# Patient Record
Sex: Female | Born: 1970 | Race: White | Hispanic: No | Marital: Married | Smoking: Never smoker
Health system: Southern US, Community
[De-identification: ages and names within clinical notes are randomized; demographics above are authoritative.]

## PROBLEM LIST (undated history)

## (undated) DIAGNOSIS — F32A Depression, unspecified: Secondary | ICD-10-CM

## (undated) DIAGNOSIS — E079 Disorder of thyroid, unspecified: Secondary | ICD-10-CM

## (undated) DIAGNOSIS — F419 Anxiety disorder, unspecified: Secondary | ICD-10-CM

## (undated) DIAGNOSIS — O24419 Gestational diabetes mellitus in pregnancy, unspecified control: Secondary | ICD-10-CM

## (undated) HISTORY — DX: Anxiety disorder, unspecified: F41.9

## (undated) HISTORY — DX: Disorder of thyroid, unspecified: E07.9

## (undated) HISTORY — DX: Depression, unspecified: F32.A

## (undated) HISTORY — PX: WISDOM TOOTH EXTRACTION: SHX21

## (undated) HISTORY — PX: COLONOSCOPY: SHX174

---

## 2007-10-15 DIAGNOSIS — O24419 Gestational diabetes mellitus in pregnancy, unspecified control: Secondary | ICD-10-CM

## 2007-10-15 HISTORY — DX: Gestational diabetes mellitus in pregnancy, unspecified control: O24.419

## 2018-09-04 ENCOUNTER — Other Ambulatory Visit: Payer: Self-pay

## 2018-09-04 ENCOUNTER — Encounter (HOSPITAL_COMMUNITY): Payer: Self-pay | Admitting: Emergency Medicine

## 2018-09-04 ENCOUNTER — Emergency Department (HOSPITAL_COMMUNITY): Payer: BLUE CROSS/BLUE SHIELD

## 2018-09-04 ENCOUNTER — Emergency Department (HOSPITAL_COMMUNITY)
Admission: EM | Admit: 2018-09-04 | Discharge: 2018-09-04 | Disposition: A | Discharge status: Home / Self Care | Payer: BLUE CROSS/BLUE SHIELD | Attending: Emergency Medicine | Admitting: Emergency Medicine

## 2018-09-04 DIAGNOSIS — R1013 Epigastric pain: Secondary | ICD-10-CM | POA: Diagnosis present

## 2018-09-04 DIAGNOSIS — M5412 Radiculopathy, cervical region: Secondary | ICD-10-CM | POA: Diagnosis not present

## 2018-09-04 HISTORY — DX: Gestational diabetes mellitus in pregnancy, unspecified control: O24.419

## 2018-09-04 LAB — CBC
HEMATOCRIT: 44.4 % (ref 36.0–46.0)
Hemoglobin: 14.3 g/dL (ref 12.0–15.0)
MCH: 30 pg (ref 26.0–34.0)
MCHC: 32.2 g/dL (ref 30.0–36.0)
MCV: 93.1 fL (ref 80.0–100.0)
NRBC: 0 % (ref 0.0–0.2)
Platelets: 261 10*3/uL (ref 150–400)
RBC: 4.77 MIL/uL (ref 3.87–5.11)
RDW: 12.2 % (ref 11.5–15.5)
WBC: 6.4 10*3/uL (ref 4.0–10.5)

## 2018-09-04 LAB — COMPREHENSIVE METABOLIC PANEL
ALK PHOS: 114 U/L (ref 38–126)
ALT: 22 U/L (ref 0–44)
AST: 23 U/L (ref 15–41)
Albumin: 4.2 g/dL (ref 3.5–5.0)
Anion gap: 10 (ref 5–15)
BILIRUBIN TOTAL: 0.9 mg/dL (ref 0.3–1.2)
BUN: 10 mg/dL (ref 6–20)
CALCIUM: 9.5 mg/dL (ref 8.9–10.3)
CO2: 21 mmol/L — AB (ref 22–32)
CREATININE: 0.74 mg/dL (ref 0.44–1.00)
Chloride: 108 mmol/L (ref 98–111)
GFR calc non Af Amer: >60 mL/min (ref >60)
GLUCOSE: 106 mg/dL — AB (ref 70–99)
Potassium: 4.2 mmol/L (ref 3.5–5.1)
SODIUM: 139 mmol/L (ref 135–145)
Total Protein: 6.7 g/dL (ref 6.5–8.1)

## 2018-09-04 LAB — LIPASE, BLOOD: Lipase: 31 U/L (ref 11–51)

## 2018-09-04 LAB — I-STAT BETA HCG BLOOD, ED (MC, WL, AP ONLY)

## 2018-09-04 LAB — I-STAT TROPONIN, ED: Troponin i, poc: 0.02 ng/mL (ref 0.00–0.08)

## 2018-09-04 MED ORDER — PREDNISONE 10 MG (21) PO TBPK: ORAL_TABLET | ORAL | 0 refills | Status: DC

## 2018-09-04 NOTE — ED Triage Notes (Signed)
Pt c/o left sharp pain this morning, went to urgent care and was sent here. Reports that she had sharp central CP last week that resolved.

## 2018-09-04 NOTE — ED Provider Notes (Signed)
[facility] EMER[facility]ENC[facility] DEPARTMENT Provider Note   CSN: [id number] Arrival date & time: 09/04/18  1027     History   Chief Complaint Chief Complaint  Patient presents with  . Arm Pain    HPI Karen Mathis is a 47 y.o. female.  Pt presents to the ED today with left arm pain.  She had some epigastric abd pain last week which lasted about 15 min and went away.  She said she woke up this morning with a sharp pain in her left arm.  The sharp pain has resolved, but she has some aching in her left elbow area.  She initially went to urgent care.  They did an EKG and told her that it was abnormal, so sent her here.  The pt denies any cp.  That EKG was reviewed and she does have 1 t wave inversion in lead III.     Past Medical History:  Diagnosis Date  . Gestational diabetes 2009    There are no active problems to display for this patient.   History reviewed. No pertinent surgical history.   OB History   None      Home Medications    Prior to Admission medications   Medication Sig Start Date End Date Taking? Authorizing Provider  predniSONE (STERAPRED UNI-PAK 21 TAB) 10 MG (21) TBPK tablet Take 6 tabs for 2 days, then 5 for 2 days, then 4 for 2 days, then 3 for 2 days, 2 for 2 days, then 1 for 2 days 09/04/18   Isla Pence, MD    Family History History reviewed. No pertinent family history.  Social History Social History   Tobacco Use  . Smoking status: Never Smoker  . Smokeless tobacco: Never Used  Substance Use Topics  . Alcohol use: Yes    Comment: occ  . Drug use: Never     Allergies   Penicillins   Review of Systems Review of Systems  Musculoskeletal:       Left arm pain  All other systems reviewed and are negative.    Physical Exam Updated Vital Signs BP (!) 123/93   Pulse 79   Temp 98.1 F (36.7 C) (Oral)   Resp 16   Ht 5\' 4"  (1.626 m)   Wt 74.8 kg   LMP 08/31/2018   SpO2 100%   BMI 28.32 kg/m   Physical  Exam  Constitutional: She is oriented to person, place, and time. She appears well-developed and well-nourished.  HENT:  Head: Normocephalic and atraumatic.  Right Ear: External ear normal.  Left Ear: External ear normal.  Nose: Nose normal.  Mouth/Throat: Oropharynx is clear and moist.  Eyes: Pupils are equal, round, and reactive to light. Conjunctivae and EOM are normal.  Neck: Normal range of motion. Neck supple.  Cardiovascular: Normal rate, regular rhythm, normal heart sounds and intact distal pulses.  Pulmonary/Chest: Effort normal and breath sounds normal.  Abdominal: Soft. Bowel sounds are normal.  Musculoskeletal: Normal range of motion.  Neurological: She is alert and oriented to person, place, and time.  Skin: Skin is warm and dry. Capillary refill takes less than 2 seconds.  Psychiatric: She has a normal mood and affect. Her behavior is normal. Judgment and thought content normal.  Nursing note and vitals reviewed.    ED Treatments / Results  Labs (all labs ordered are listed, but only abnormal results are displayed) Labs Reviewed  COMPREHENSIVE METABOLIC PANEL - Abnormal; Notable for the following components:  Result Value   CO2 21 (*)    Glucose, Bld 106 (*)    All other components within normal limits  CBC  LIPASE, BLOOD  I-STAT TROPONIN, ED  I-STAT BETA HCG BLOOD, ED ([facility], [facility], [facility] ONLY)    EKG EKG Interpretation  Date/Time:  Friday September 04 2018 10:40:49 EST Ventricular Rate:  99 PR Interval:    QRS Duration: 86 QT Interval:  337 QTC Calculation: 433 R Axis:   97 Text Interpretation:  Sinus rhythm Borderline right axis deviation No old tracing to compare Confirmed by Isla Pence [id number]) on 09/04/2018 10:54:34 AM   Radiology Dg Chest 2 View  Result Date: 09/04/2018 CLINICAL DATA:  Pt c/o left sided neck pains x this morning radiating down her left arm and elbow. Pt c/o ongoing dry cough for "a while". Denies SOB. No known injuries to chest  or cervical spine. cp EXAM: CHEST - 2 VIEW COMPARISON:  None. FINDINGS: Normal mediastinum and cardiac silhouette. Normal pulmonary vasculature. No evidence of effusion, infiltrate, or pneumothorax. No acute bony abnormality. IMPRESSION: No acute cardiopulmonary process. Electronically Signed   By: Suzy Bouchard M.D.   On: 09/04/2018 12:30   Dg Cervical Spine Complete  Result Date: 09/04/2018 CLINICAL DATA:  Left-sided neck pain this morning, extending into the left arm. Ongoing dry cough. No acute injury. EXAM: CERVICAL SPINE - COMPLETE 4+ VIEW COMPARISON:  None. FINDINGS: The prevertebral soft tissues are normal. The alignment is normal aside from mild straightening. There is no evidence of acute fracture or traumatic subluxation. There is mild disc space narrowing and uncinate spurring at C4-5. Oblique views demonstrate no significant osseous foraminal narrowing. The C1-2 articulation appears normal in the AP projection. IMPRESSION: No evidence of acute cervical spine fracture or traumatic subluxation. Mild spondylosis at C4-5 without significant foraminal narrowing. Electronically Signed   By: Richardean Sale M.D.   On: 09/04/2018 12:34    Procedures Procedures (including critical care time)  Medications Ordered in ED Medications - No data to display   Initial Impression / Assessment and Plan / ED Course  I have reviewed the triage vital signs and the nursing notes.  Pertinent labs & imaging results that were available during my care of the patient were reviewed by me and considered in my medical decision making (see chart for details).    Pt has a heart score of 1 for age.  She likely has cervical radiculopathy.  She is stable for d/c.  Return if worse.  F/u with pcp.  Final Clinical Impressions(s) / ED Diagnoses   Final diagnoses:  Cervical radiculopathy    ED Discharge Orders         Ordered    predniSONE (STERAPRED UNI-PAK 21 TAB) 10 MG (21) TBPK tablet     09/04/18 1240            Isla Pence, MD 09/04/18 1242

## 2020-03-07 IMAGING — DX DG CHEST 2V
2 series · 2 of 2 positions shown · non-contrast
Comparison: None.

CLINICAL DATA: Pt c/o left sided neck pains x this morning
radiating down her left arm and elbow. Pt c/o ongoing dry cough for
"a while". Denies SOB. No known injuries to chest or cervical spine.
cp

EXAM:
CHEST - 2 VIEW

[chest pa]
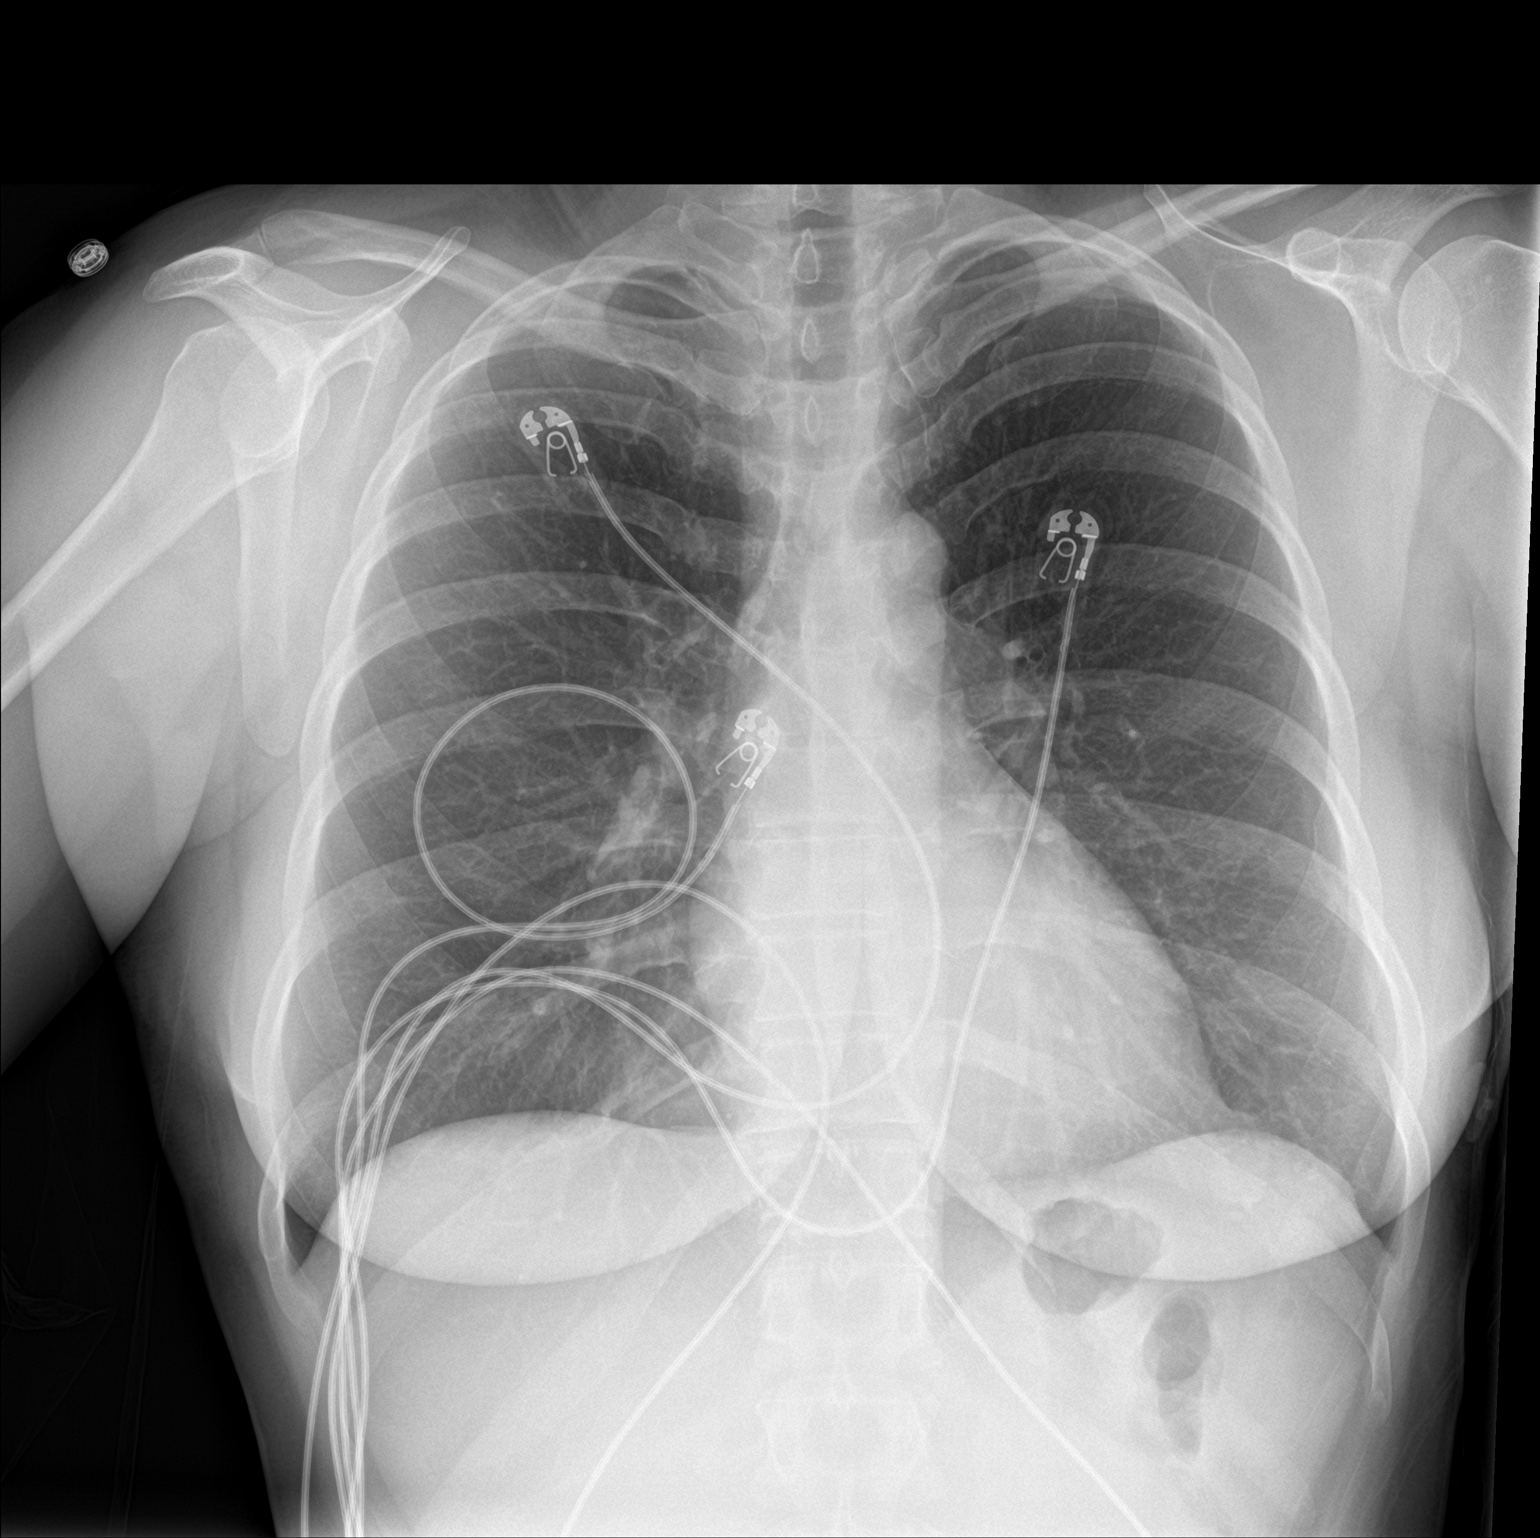

[chest lat]
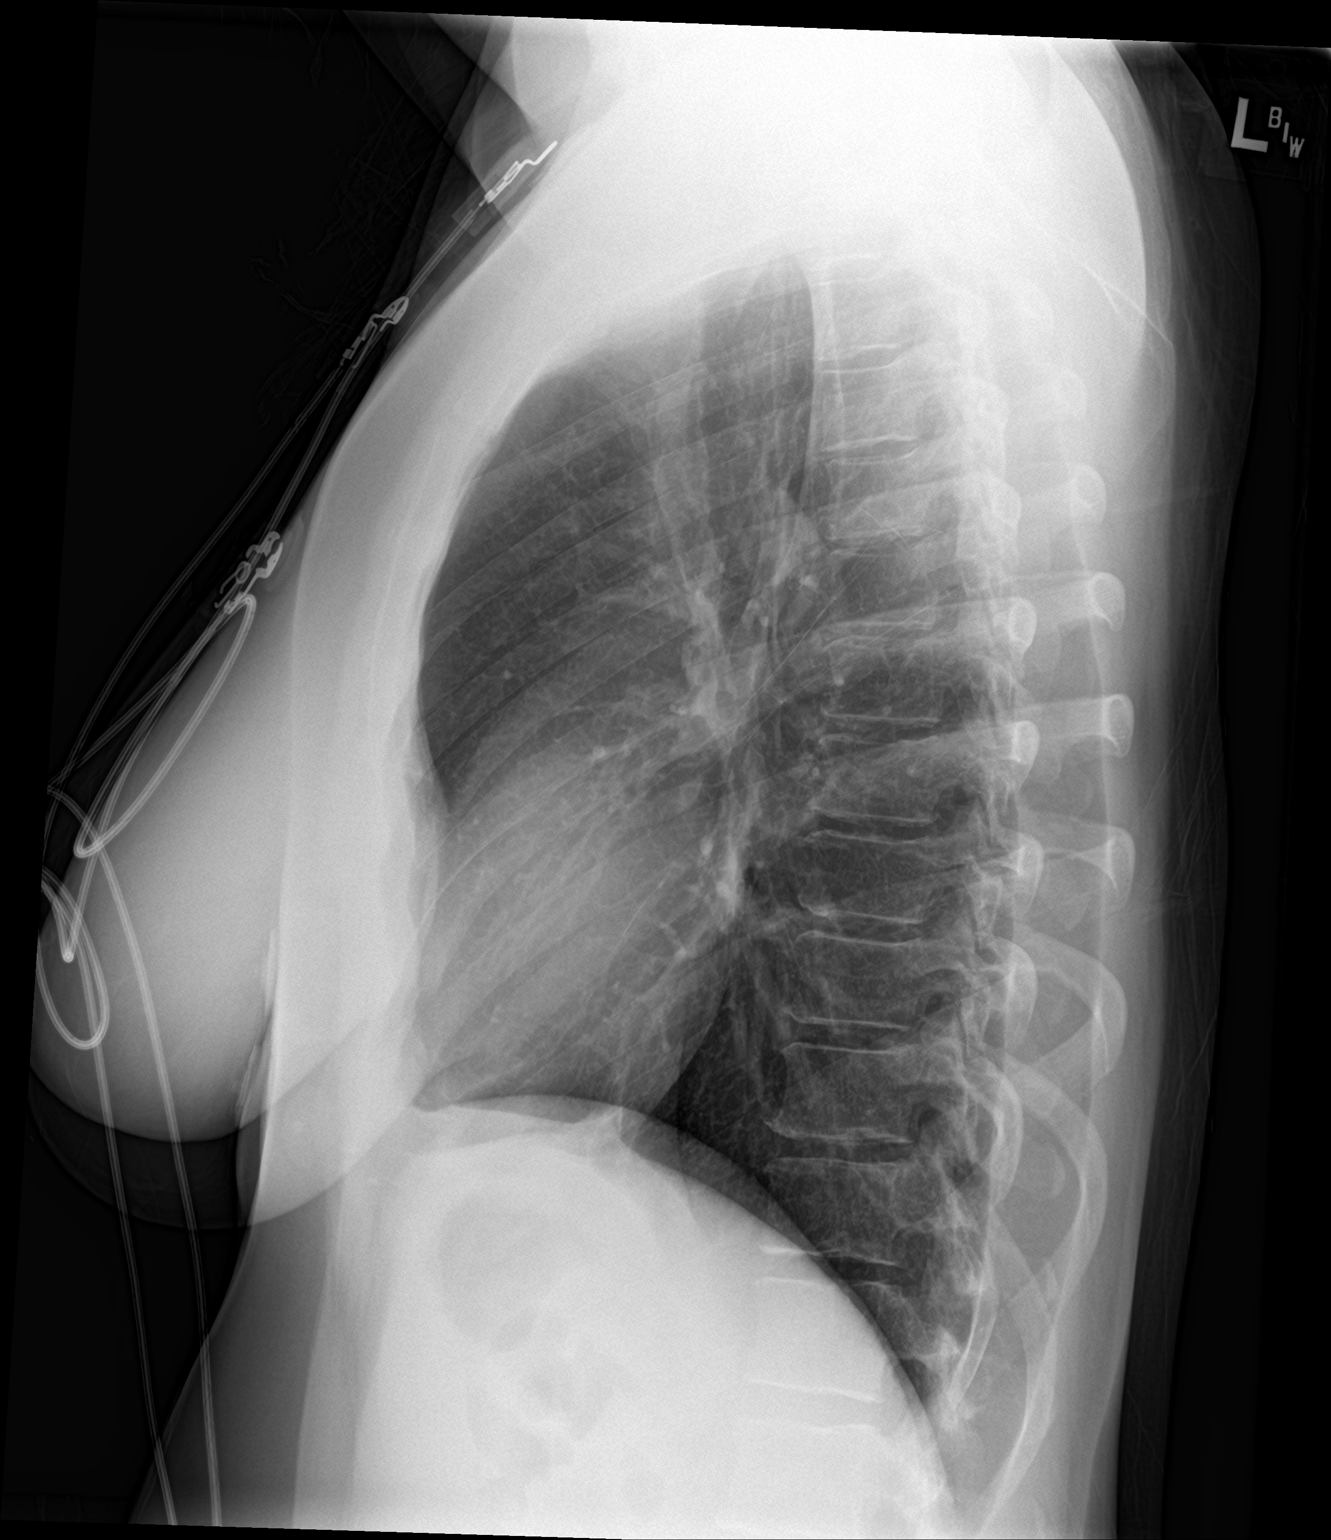

[2 of 2 positions shown; findings below may reference images not displayed]

FINDINGS: Normal mediastinum and cardiac silhouette. Normal pulmonary
vasculature. No evidence of effusion, infiltrate, or pneumothorax.
No acute bony abnormality.
IMPRESSION: No acute cardiopulmonary process.

## 2020-03-07 IMAGING — DX DG CERVICAL SPINE COMPLETE 4+V
6 series · 7 of 7 positions shown · non-contrast
Comparison: None.

CLINICAL DATA: Left-sided neck pain this morning, extending into
the left arm. Ongoing dry cough. No acute injury.

EXAM:
CERVICAL SPINE - COMPLETE 4+ VIEW

[Series 1: c-spine lat · 0.14mm/px · 2 of 2 slices shown]
[im 1/2]
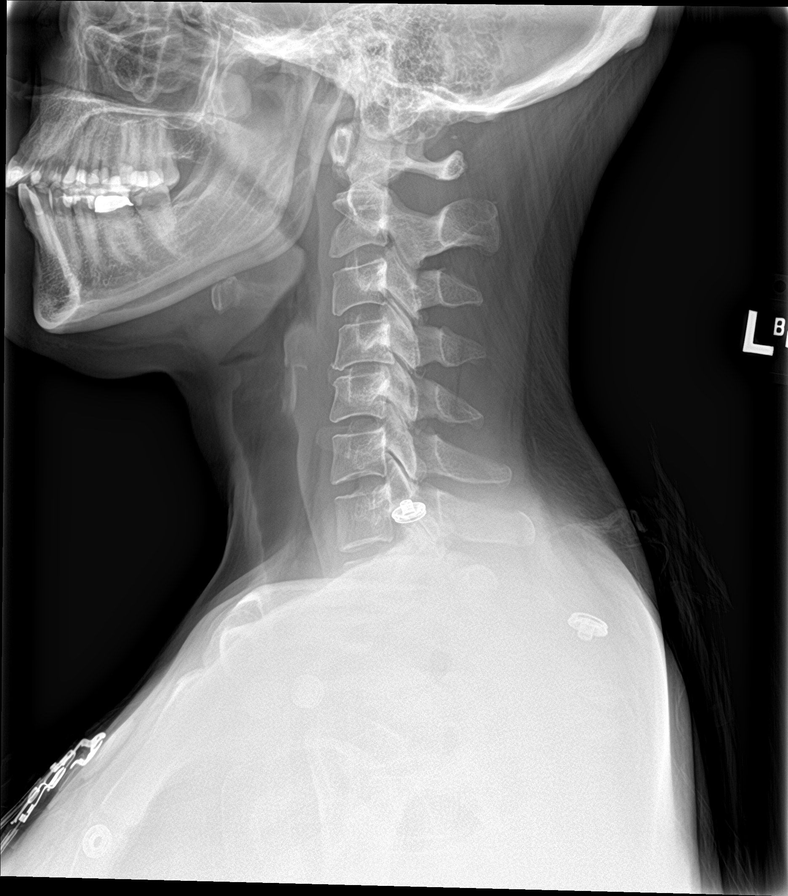
[im 2/2]
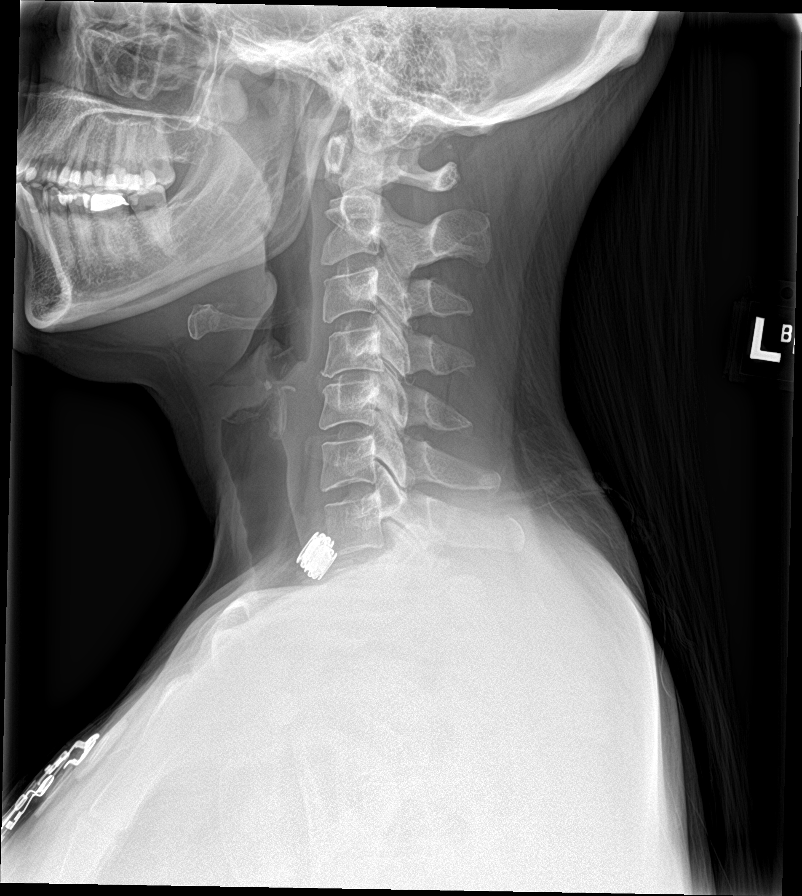

[c-spine obl (1 of 2)]
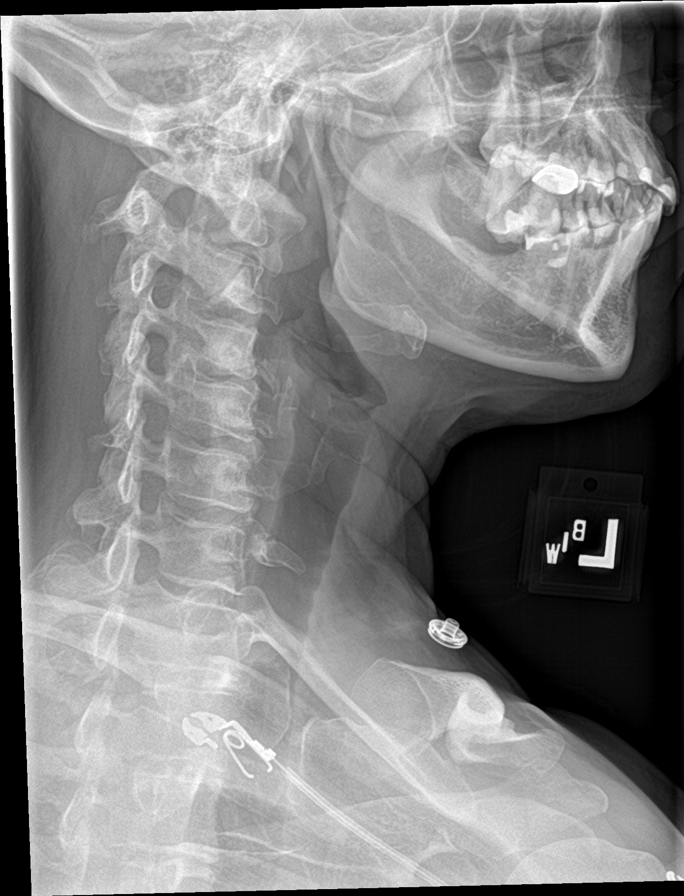

[c-spine obl (2 of 2)]
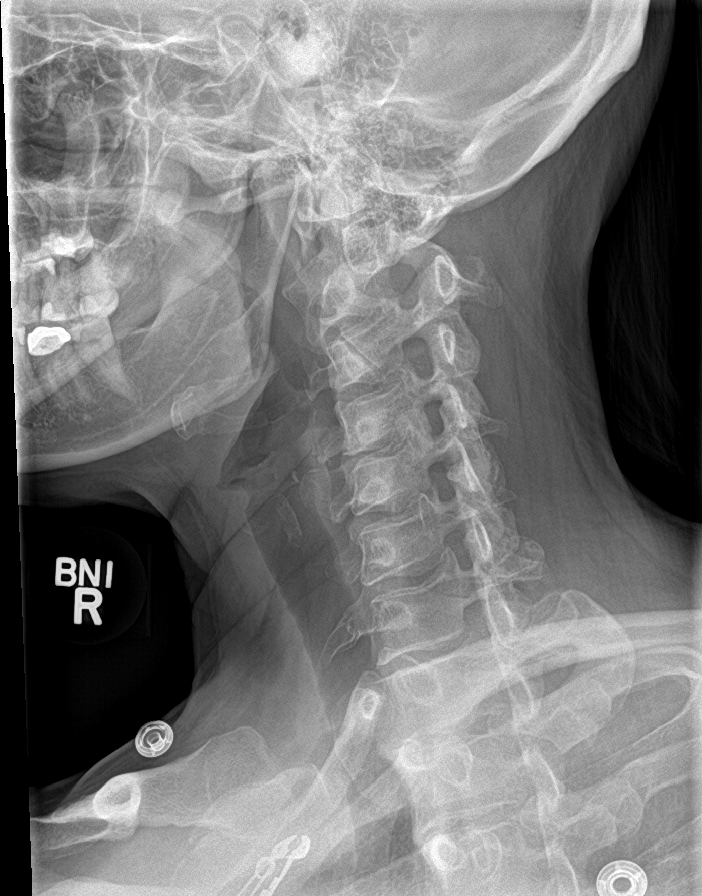

[c-spine ap]
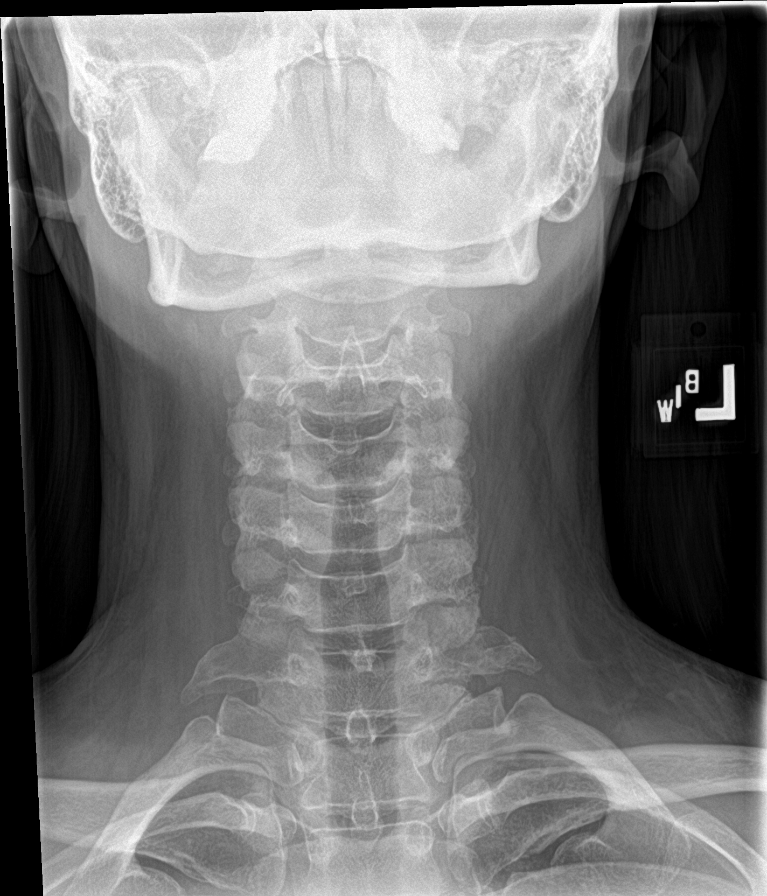

[c-spine open mouth]
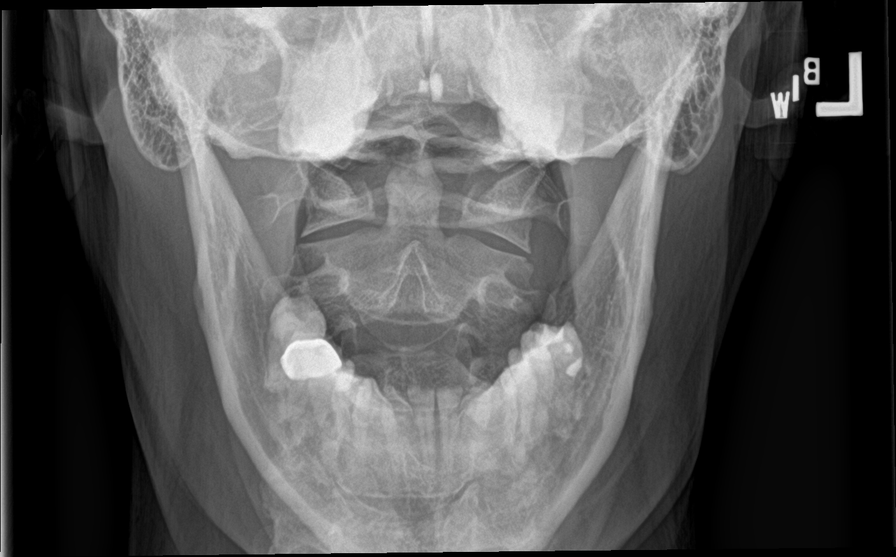

[c-spine swimmers]
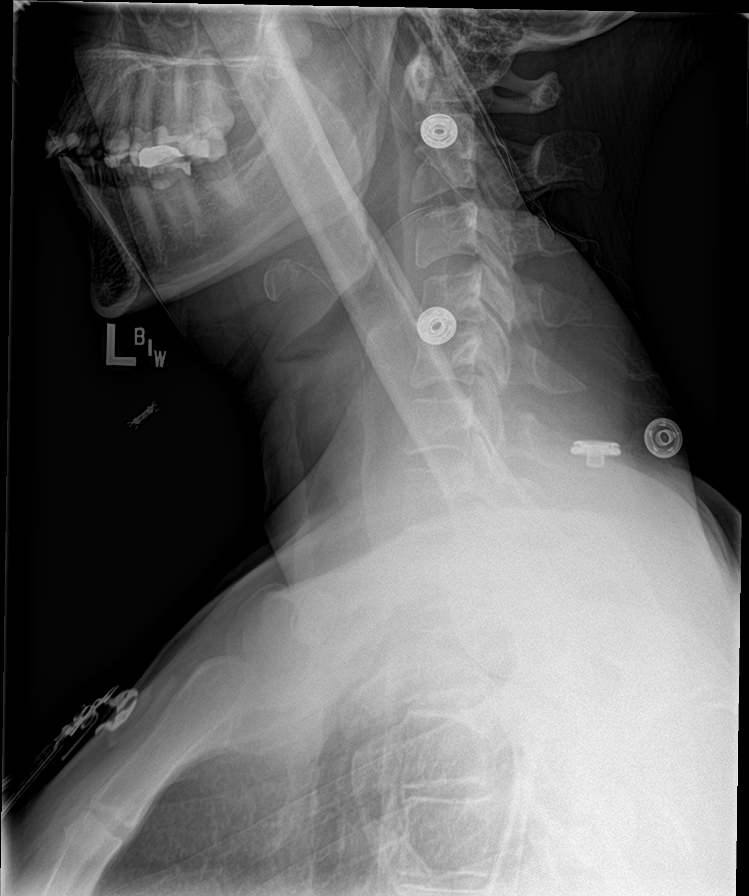

[7 of 7 positions shown; findings below may reference images not displayed]

FINDINGS: The prevertebral soft tissues are normal. The alignment is normal
aside from mild straightening. There is no evidence of acute
fracture or traumatic subluxation. There is mild disc space
narrowing and uncinate spurring at C4-5. Oblique views demonstrate
no significant osseous foraminal narrowing. The C1-2 articulation
appears normal in the AP projection.
IMPRESSION: No evidence of acute cervical spine fracture or traumatic
subluxation. Mild spondylosis at C4-5 without significant foraminal
narrowing.

## 2022-01-16 ENCOUNTER — Encounter: Payer: Self-pay | Admitting: Internal Medicine

## 2022-02-07 ENCOUNTER — Ambulatory Visit (AMBULATORY_SURGERY_CENTER): Payer: BC Managed Care – PPO | Admitting: *Deleted

## 2022-02-07 VITALS — Ht 64.0 in | Wt 152.0 lb

## 2022-02-07 DIAGNOSIS — Z1211 Encounter for screening for malignant neoplasm of colon: Secondary | ICD-10-CM

## 2022-02-07 NOTE — Progress Notes (Signed)
Patient's pre-visit was done today over the phone with the patient. Name,DOB and address verified. Patient denies any allergies to Eggs and Soy. Patient denies any problems with anesthesia/sedation. Patient is not taking any diet pills or blood thinners. No home Oxygen.  ?Prep instructions mailed to pt-pt is aware. Patient understands to call us back with any questions or concerns. Patient is aware of our care-partner policy. Patient aware to stop Saxenda injection 2 days prior to procedure. ? ?

## 2022-02-28 ENCOUNTER — Encounter: Payer: BLUE CROSS/BLUE SHIELD | Admitting: Internal Medicine

## 2022-04-23 ENCOUNTER — Encounter: Payer: Self-pay | Admitting: Internal Medicine

## 2022-04-26 ENCOUNTER — Encounter: Payer: Self-pay | Admitting: Internal Medicine

## 2022-04-26 ENCOUNTER — Ambulatory Visit (AMBULATORY_SURGERY_CENTER): Payer: BC Managed Care – PPO | Admitting: Internal Medicine

## 2022-04-26 VITALS — BP 117/73 | HR 72 | Temp 99.3°F | Resp 17 | Ht 64.0 in | Wt 152.0 lb

## 2022-04-26 DIAGNOSIS — Z1211 Encounter for screening for malignant neoplasm of colon: Secondary | ICD-10-CM | POA: Diagnosis present

## 2022-04-26 DIAGNOSIS — D120 Benign neoplasm of cecum: Secondary | ICD-10-CM

## 2022-04-26 DIAGNOSIS — K648 Other hemorrhoids: Secondary | ICD-10-CM | POA: Diagnosis not present

## 2022-04-26 DIAGNOSIS — K635 Polyp of colon: Secondary | ICD-10-CM

## 2022-04-26 MED ORDER — SODIUM CHLORIDE 0.9 % IV SOLN: 500.0000 mL | Freq: Once | INTRAVENOUS | Status: DC

## 2022-04-26 NOTE — Op Note (Signed)
[facility] Patient Name: Karen Mathis Procedure Date: 04/26/2022 7:49 AM MRN: [id number] Endoscopist: Sonny Masters "Karen Mathis ,  Age: 51 Referring MD:  Date of Birth: Aug 02, 1971 Gender: Female Account #: [id number] Procedure:                Colonoscopy Indications:              Screening for colorectal malignant neoplasm Medicines:                Monitored Anesthesia Care Procedure:                Pre-Anesthesia Assessment:                           - Prior to the procedure, a History and Physical                            was performed, and patient medications and                            allergies were reviewed. The patient's tolerance of                            previous anesthesia was also reviewed. The risks                            and benefits of the procedure and the sedation                            options and risks were discussed with the patient.                            All questions were answered, and informed consent                            was obtained. Prior Anticoagulants: The patient has                            taken no previous anticoagulant or antiplatelet                            agents. ASA Grade Assessment: II - A patient with                            mild systemic disease. After reviewing the risks                            and benefits, the patient was deemed in                            satisfactory condition to undergo the procedure.                           After obtaining informed consent, the colonoscope  was passed under direct vision. Throughout the                            procedure, the patient's blood pressure, pulse, and                            oxygen saturations were monitored continuously. The                            Olympus CF-HQ190L [id number]) Colonoscope was                            introduced through the anus and advanced to the the                            terminal  ileum. The quality of the bowel                            preparation was adequate. The terminal ileum,                            ileocecal valve, appendiceal orifice, and rectum                            were photographed. Scope In: 8:21:33 AM Scope Out: 8:44:36 AM Scope Withdrawal Time: 0 hours 16 minutes 14 seconds  Total Procedure Duration: 0 hours 23 minutes 3 seconds  Findings:                 The terminal ileum appeared normal.                           A 2 mm polyp was found in the cecum. The polyp was                            sessile. The polyp was removed with a cold biopsy                            forceps. Resection and retrieval were complete.                           Non-bleeding internal hemorrhoids were found during                            retroflexion. Complications:            No immediate complications. Estimated Blood Loss:     Estimated blood loss was minimal. Impression:               - The examined portion of the ileum was normal.                           - One 2 mm polyp in the cecum, removed with a cold  biopsy forceps. Resected and retrieved.                           - Non-bleeding internal hemorrhoids. Recommendation:           - Discharge patient to home (with escort).                           - Await pathology results.                           - The findings and recommendations were discussed                            with the patient. Sonny Masters "Karen Mathis,  04/26/2022 8:52:53 AM

## 2022-04-26 NOTE — Progress Notes (Signed)
A and O x3. Report to RN. Tolerated MAC anesthesia well. 

## 2022-04-26 NOTE — Progress Notes (Signed)
GASTROENTEROLOGY PROCEDURE H&P NOTE   Primary Care Physician: Arnetha Gula, MD    Reason for Procedure:   Colon cancer screening  Plan:    Colonoscopy  Patient is appropriate for endoscopic procedure(s) in the ambulatory ([facility]) setting.  The nature of the procedure, as well as the risks, benefits, and alternatives were carefully and thoroughly reviewed with the patient. Ample time for discussion and questions allowed. The patient understood, was satisfied, and agreed to proceed.     HPI: Karen Mathis is a 51 y.o. female who presents for colonoscopy for colon cancer screening. Denies changes in bowel habits or weight loss. She has seen some rectal bleeding on occasion, sometimes on the toilet paper and other times in the stools. She is adopted so she is not clear about her family history of colon cancer. Last had a colonoscopy in her 8s when she was found to have a polyp. She was recommended for a 10 year follow up colonoscopy.  Past Medical History:  Diagnosis Date   Anxiety    Gestational diabetes 2009   Thyroid disease     Past Surgical History:  Procedure Laterality Date   COLONOSCOPY     age 75; polyps removed per pt   WISDOM TOOTH EXTRACTION      Prior to Admission medications   Medication Sig Start Date End Date Taking? Authorizing Provider  ARMOUR THYROID 60 MG tablet Take by mouth. 12/30/21   [provider]  buPROPion (WELLBUTRIN XL) 150 MG 24 hr tablet Take 150 mg by mouth every morning. 12/24/21   [provider]  clorazepate (TRANXENE) 3.75 MG tablet Take 3.75-7.5 mg by mouth daily as needed. 08/16/21   [provider]  FLUoxetine (PROZAC) 20 MG capsule Take 20 mg by mouth daily. 12/24/21   [provider]  Liraglutide -Weight Management (SAXENDA) 18 MG/3ML SOPN Inject into the skin daily.    [provider]  Multiple Vitamin (MULTIVITAMIN) tablet Take 1 tablet by mouth daily.    [provider]   progesterone (PROMETRIUM) 100 MG capsule Take 100 mg by mouth daily.    [provider]    Current Outpatient Medications  Medication Sig Dispense Refill   ARMOUR THYROID 60 MG tablet Take by mouth.     buPROPion (WELLBUTRIN XL) 150 MG 24 hr tablet Take 150 mg by mouth every morning.     clorazepate (TRANXENE) 3.75 MG tablet Take 3.75-7.5 mg by mouth daily as needed.     FLUoxetine (PROZAC) 20 MG capsule Take 20 mg by mouth daily.     Liraglutide -Weight Management (SAXENDA) 18 MG/3ML SOPN Inject into the skin daily.     Multiple Vitamin (MULTIVITAMIN) tablet Take 1 tablet by mouth daily.     progesterone (PROMETRIUM) 100 MG capsule Take 100 mg by mouth daily.     Current Facility-Administered Medications  Medication Dose Route Frequency Provider Last Rate Last Admin   0.9 %  sodium chloride infusion  500 mL Intravenous Once Sharyn Creamer, MD        Allergies as of 04/26/2022 - Review Complete 04/26/2022  Allergen Reaction Noted   Penicillins Nausea And Vomiting 09/04/2018    Family History  Adopted: Yes  Problem Relation Age of Onset   Colon cancer Neg Hx    Esophageal cancer Neg Hx    Stomach cancer Neg Hx    Rectal cancer Neg Hx     Social History   Socioeconomic History   Marital status:  Married    Spouse name: Not on file   Number of children: Not on file   Years of education: Not on file   Highest education level: Not on file  Occupational History   Not on file  Tobacco Use   Smoking status: Never   Smokeless tobacco: Never  Vaping Use   Vaping Use: Never used  Substance and Sexual Activity   Alcohol use: Yes    Alcohol/week: 2.0 standard drinks of alcohol    Types: 2 Glasses of wine per week   Drug use: Never   Sexual activity: Not on file  Other Topics Concern   Not on file  Social History Narrative   Not on file   Social Determinants of Health   Financial Resource Strain: Not on file  Food Insecurity: Not on file  Transportation  Needs: Not on file  Physical Activity: Not on file  Stress: Not on file  Social Connections: Not on file  Intimate Partner Violence: Not on file    Physical Exam: Vital signs in last 24 hours: BP 134/70   Pulse 89   Temp 99.3 F (37.4 C) (Temporal)   Ht '5\' 4"'$  (1.626 m)   Wt 152 lb (68.9 kg)   SpO2 98%   BMI 26.09 kg/m  GEN: NAD EYE: Sclerae anicteric ENT: MMM CV: Non-tachycardic Pulm: No increased work of breathing GI: Soft, NT/ND NEURO:  Alert & Oriented   Christia Reading, MD [facility]  04/26/2022 8:04 AM

## 2022-04-26 NOTE — Patient Instructions (Signed)
Handout on polyps given to patient.  Await pathology results. Repeat colonoscopy for surveillance will be determined based off of pathology results. Resume previous diet and continue present medications.   YOU HAD AN ENDOSCOPIC PROCEDURE TODAY AT [facility]:   Refer to the procedure report that was given to you for any specific questions about what was found during the examination.  If the procedure report does not answer your questions, please call your gastroenterologist to clarify.  If you requested that your care partner not be given the details of your procedure findings, then the procedure report has been included in a sealed envelope for you to review at your convenience later.  YOU SHOULD EXPECT: Some feelings of bloating in the abdomen. Passage of more gas than usual.  Walking can help get rid of the air that was put into your GI tract during the procedure and reduce the bloating. If you had a lower endoscopy (such as a colonoscopy or flexible sigmoidoscopy) you may notice spotting of blood in your stool or on the toilet paper. If you underwent a bowel prep for your procedure, you may not have a normal bowel movement for a few days.  Please Note:  You might notice some irritation and congestion in your nose or some drainage.  This is from the oxygen used during your procedure.  There is no need for concern and it should clear up in a day or so.  SYMPTOMS TO REPORT IMMEDIATELY:  Following lower endoscopy (colonoscopy or flexible sigmoidoscopy):  Excessive amounts of blood in the stool  Significant tenderness or worsening of abdominal pains  Swelling of the abdomen that is new, acute  Fever of 100F or higher  For urgent or emergent issues, a gastroenterologist can be reached at any hour by calling [phone]. Do not use MyChart messaging for urgent concerns.    DIET:  We do recommend a small meal at first, but then you may proceed to your regular diet.  Drink  plenty of fluids but you should avoid alcoholic beverages for 24 hours.  ACTIVITY:  You should plan to take it easy for the rest of today and you should NOT DRIVE or use heavy machinery until tomorrow (because of the sedation medicines used during the test).    FOLLOW UP: Our staff will call the number listed on your records the next business day following your procedure.  We will call around 7:15- 8:00 am to check on you and address any questions or concerns that you may have regarding the information given to you following your procedure. If we do not reach you, we will leave a message.  If you develop any symptoms (ie: fever, flu-like symptoms, shortness of breath, cough etc.) before then, please call [phone].  If you test positive for Covid 19 in the 2 weeks post procedure, please call and report this information to [place].    If any biopsies were taken you will be contacted by phone or by letter within the next 1-3 weeks.  Please call us at [phone] if you have not heard about the biopsies in 3 weeks.    SIGNATURES/CONFIDENTIALITY: You and/or your care partner have signed paperwork which will be entered into your electronic medical record.  These signatures attest to the fact that that the information above on your After Visit Summary has been reviewed and is understood.  Full responsibility of the confidentiality of this discharge information lies with you and/or your care-partner.

## 2022-04-29 ENCOUNTER — Telehealth: Payer: Self-pay | Admitting: *Deleted

## 2022-04-29 NOTE — Telephone Encounter (Signed)
  Follow up Call-     04/26/2022    8:05 AM  Call back number  Post procedure Call Back phone  # [phone]  Permission to leave phone message Yes     Patient questions:  Do you have a fever, pain , or abdominal swelling? No. Pain Score  0 *  Have you tolerated food without any problems? Yes.    Have you been able to return to your normal activities? Yes.    Do you have any questions about your discharge instructions: Diet   No. Medications  No. Follow up visit  No.  Do you have questions or concerns about your Care? No.  Actions: * If pain score is 4 or above: No action needed, pain <4.

## 2022-05-01 ENCOUNTER — Encounter: Payer: Self-pay | Admitting: Internal Medicine

## 2024-11-12 ENCOUNTER — Encounter: Payer: Self-pay | Admitting: Sports Medicine

## 2024-11-12 ENCOUNTER — Ambulatory Visit: Admitting: Sports Medicine

## 2024-11-12 VITALS — BP 122/89 | HR 94 | Temp 98.2°F | Ht 64.0 in | Wt 162.0 lb

## 2024-11-12 DIAGNOSIS — R053 Chronic cough: Secondary | ICD-10-CM

## 2024-11-12 DIAGNOSIS — Z113 Encounter for screening for infections with a predominantly sexual mode of transmission: Secondary | ICD-10-CM

## 2024-11-12 DIAGNOSIS — R0981 Nasal congestion: Secondary | ICD-10-CM

## 2024-11-12 DIAGNOSIS — E039 Hypothyroidism, unspecified: Secondary | ICD-10-CM

## 2024-11-12 DIAGNOSIS — Z13220 Encounter for screening for lipoid disorders: Secondary | ICD-10-CM

## 2024-11-12 DIAGNOSIS — F411 Generalized anxiety disorder: Secondary | ICD-10-CM | POA: Diagnosis not present

## 2024-11-12 DIAGNOSIS — R051 Acute cough: Secondary | ICD-10-CM

## 2024-11-12 DIAGNOSIS — Z131 Encounter for screening for diabetes mellitus: Secondary | ICD-10-CM

## 2024-11-12 MED ORDER — FEXOFENADINE HCL 180 MG PO TABS: 180.0000 mg | ORAL_TABLET | Freq: Every day | ORAL | 0 refills | Status: AC

## 2024-11-12 MED ORDER — FLUTICASONE PROPIONATE 50 MCG/ACT NA SUSP: 2.0000 | Freq: Every day | NASAL | 6 refills | Status: AC

## 2024-11-12 MED ORDER — FLUOXETINE HCL 20 MG PO CAPS: 20.0000 mg | ORAL_CAPSULE | Freq: Every day | ORAL | 0 refills | Status: AC

## 2024-11-12 NOTE — Patient Instructions (Signed)

## 2024-11-12 NOTE — Progress Notes (Unsigned)
 "  New Patient Office Visit  Patient ID: Karen Mathis, Female   DOB: 11/27/1970 54 y.o. MRN: [id number] Subjective:     Discussed the use of AI scribe software for clinical note transcription with the patient, who gave verbal consent to proceed.  History of Present Illness Karen Mathis is a 54 year old female who presents to establish care.  She is seeking a new primary care provider after not visiting her previous primary care doctor for over a year. Her thyroid management has been primarily overseen by a thyroid specialist. She has been on Armour Thyroid since her twenties after trying Synthroid and levothyroxine without noticeable improvement. Her thyroid levels are typically checked every six months, with the last check being six months ago.  She has a chronic cough persisting for over five years, which became more pronounced after her first COVID-19 infection. The cough is sometimes dry but often productive, occurring more in the mornings, especially when drinking coffee or water, and sometimes when talking. She has tried Claritin and Allegra  without relief and is currently taking omeprazole for GERD, which has not improved her cough. She reports that a pulmonologist performed a CT scan and pulmonary function test, both of which were normal. She has not seen an ENT specialist but was informed by an oral surgeon that her nasal passages appeared inflamed on a CT scan.  She experiences fluctuating blood pressure readings, with a significant episode in October where she felt lightheaded and had a blood pressure of 183/100. She checks her blood pressure in the mornings and evenings, noting occasional high readings. She is not currently on any blood pressure medication.  She has a history of complex PTSD and mild anxiety, previously managed with therapy and medication. She stopped taking Wellbutrin and Prozac  about two years ago when she felt stable. She acknowledges experiencing  anxiety, sometimes without recognizing it, and has worked with a paramedic and psychiatrist previously.  Her current medications include Armour Thyroid and progesterone, along with a multivitamin. She has not been on any psychiatric medications for the past two years.    Outpatient Encounter Medications as of 11/12/2024  Medication Sig   albuterol (VENTOLIN HFA) 108 (90 Base) MCG/ACT inhaler Inhale 2 puffs into the lungs.   fexofenadine  (ALLEGRA  ALLERGY) 180 MG tablet Take 1 tablet (180 mg total) by mouth daily.   FLUoxetine  (PROZAC ) 20 MG capsule Take 1 capsule (20 mg total) by mouth daily.   fluticasone  (FLONASE ) 50 MCG/ACT nasal spray Place 2 sprays into both nostrils daily.   [DISCONTINUED] fluconazole (DIFLUCAN) 150 MG tablet Take 150 mg by mouth.   [DISCONTINUED] promethazine-dextromethorphan (PROMETHAZINE-DM) 6.25-15 MG/5ML syrup Take 5 mLs by mouth.   ARMOUR THYROID 60 MG tablet Take by mouth.   Multiple Vitamin (MULTIVITAMIN) tablet Take 1 tablet by mouth daily. (Patient not taking: Reported on 11/12/2024)   progesterone (PROMETRIUM) 100 MG capsule Take 100 mg by mouth daily. (Patient not taking: Reported on 11/12/2024)   [DISCONTINUED] amphetamine-dextroamphetamine (ADDERALL) 10 MG tablet Take 10 mg by mouth.   [DISCONTINUED] buPROPion (WELLBUTRIN XL) 150 MG 24 hr tablet Take 150 mg by mouth every morning.   [DISCONTINUED] clorazepate (TRANXENE) 3.75 MG tablet Take 3.75-7.5 mg by mouth daily as needed. (Patient not taking: Reported on 04/26/2022)   [DISCONTINUED] FLUoxetine  (PROZAC ) 20 MG capsule Take 20 mg by mouth daily.   [DISCONTINUED] Liraglutide -Weight Management (SAXENDA) 18 MG/3ML SOPN Inject into the skin daily. (Patient not taking: Reported on 04/26/2022)   No facility-administered  encounter medications on file as of 11/12/2024.    Past Medical History:  Diagnosis Date   Anxiety    Depression    Gestational diabetes 2009   Thyroid disease     Past Surgical History:   Procedure Laterality Date   COLONOSCOPY     age 56; polyps removed per pt   WISDOM TOOTH EXTRACTION      Family History  Adopted: Yes  Problem Relation Age of Onset   Colon cancer Neg Hx    Esophageal cancer Neg Hx    Stomach cancer Neg Hx    Rectal cancer Neg Hx     Social History   Socioeconomic History   Marital status: Married    Spouse name: Not on file   Number of children: Not on file   Years of education: Not on file   Highest education level: Not on file  Occupational History   Not on file  Tobacco Use   Smoking status: Never   Smokeless tobacco: Never  Vaping Use   Vaping status: Never Used  Substance and Sexual Activity   Alcohol use: Yes    Alcohol/week: 2.0 standard drinks of alcohol    Types: 2 Glasses of wine per week   Drug use: Never   Sexual activity: Yes    Partners: Male  Other Topics Concern   Not on file  Social History Narrative   Not on file   Social Drivers of Health   Tobacco Use: Low Risk (11/12/2024)   Patient History    Smoking Tobacco Use: Never    Smokeless Tobacco Use: Never    Passive Exposure: Not on file  Financial Resource Strain: Not on file  Food Insecurity: Not on file  Transportation Needs: Not on file  Physical Activity: Not on file  Stress: Not on file  Social Connections: Not on file  Intimate Partner Violence: Not on file  Depression (PHQ2-9): High Risk (11/12/2024)   Depression (PHQ2-9)    PHQ-2 Score: 13  Alcohol Screen: Not on file  Housing: Not on file  Utilities: Not on file  Health Literacy: Not on file    Review of Systems  Constitutional:  Negative for chills and fever.  HENT:  Negative for congestion and sore throat.   Eyes:  Negative for double vision.  Respiratory:  Negative for cough, sputum production and shortness of breath.   Cardiovascular:  Negative for chest pain, palpitations and leg swelling.  Gastrointestinal:  Negative for abdominal pain, heartburn and nausea.  Genitourinary:   Negative for dysuria, frequency and hematuria.  Musculoskeletal:  Negative for falls and myalgias.  Neurological:  Negative for dizziness, sensory change and focal weakness.  Psychiatric/Behavioral:  Positive for depression. The patient is nervous/anxious.      Objective:    BP 122/89   Pulse 94   Temp 98.2 F (36.8 C) (Oral)   Ht 5' 4 (1.626 m)   Wt 162 lb (73.5 kg)   SpO2 96%   BMI 27.81 kg/m   Physical Exam Constitutional:      Appearance: Normal appearance.  HENT:     Head: Normocephalic and atraumatic.  Cardiovascular:     Rate and Rhythm: Normal rate and regular rhythm.  Pulmonary:     Effort: Pulmonary effort is normal. No respiratory distress.     Breath sounds: Normal breath sounds. No wheezing.  Abdominal:     General: Bowel sounds are normal. There is no distension.     Tenderness: There is no  abdominal tenderness. There is no guarding or rebound.     Comments:    Musculoskeletal:        General: No swelling or tenderness.  Skin:    General: Skin is dry.  Neurological:     Mental Status: She is alert. Mental status is at baseline.     Sensory: No sensory deficit.     Motor: No weakness.     Last CBC Lab Results  Component Value Date   WBC 6.4 09/04/2018   HGB 14.3 09/04/2018   HCT 44.4 09/04/2018   MCV 93.1 09/04/2018   MCH 30.0 09/04/2018   RDW 12.2 09/04/2018   PLT 261 09/04/2018   Last metabolic panel Lab Results  Component Value Date   GLUCOSE 106 (H) 09/04/2018   NA 139 09/04/2018   K 4.2 09/04/2018   CL 108 09/04/2018   CO2 21 (L) 09/04/2018   BUN 10 09/04/2018   CREATININE 0.74 09/04/2018   GFRNONAA >60 09/04/2018   CALCIUM 9.5 09/04/2018   PROT 6.7 09/04/2018   ALBUMIN 4.2 09/04/2018   BILITOT 0.9 09/04/2018   ALKPHOS 114 09/04/2018   AST 23 09/04/2018   ALT 22 09/04/2018   ANIONGAP 10 09/04/2018   Last lipids No results found for: CHOL, HDL, LDLCALC, LDLDIRECT, TRIG, CHOLHDL Last hemoglobin A1c No results  found for: HGBA1C Last thyroid functions No results found for: TSH, T3TOTAL, T4TOTAL, FREET4, THYROIDAB Last vitamin D No results found for: 25OHVITD2, 25OHVITD3, VD25OH Last vitamin B12 and Folate No results found for: VITAMINB12, FOLATE     Assessment & Plan:   Assessment & Plan Chronic cough Denies fevers, chills, night sweats  Pt followed with pulmonology Will instruct the patient to take claritin, flonase , omeprazole Will follow up in 6 weeks and if no improvement , will refer to ENT Orders:   CBC with Differential/Platelet; Future   COMPLETE METABOLIC PANEL WITHOUT GFR; Future  Orders:   CBC with Differential/Platelet; Future   COMPLETE METABOLIC PANEL WITHOUT GFR; Future Sinus congestion No sinus tenderness Orders:   fluticasone  (FLONASE ) 50 MCG/ACT nasal spray; Place 2 sprays into both nostrils daily.   fexofenadine  (ALLEGRA  ALLERGY) 180 MG tablet; Take 1 tablet (180 mg total) by mouth daily.  GAD (generalized anxiety disorder) Pt was tearful during the visit Will start prozac  Will follow up in 6 weeks  Orders:   FLUoxetine  (PROZAC ) 20 MG capsule; Take 1 capsule (20 mg total) by mouth daily.   CBC with Differential/Platelet; Future   COMPLETE METABOLIC PANEL WITHOUT GFR; Future  Screening for diabetes mellitus  Orders:   Hemoglobin A1c; Future  Acquired hypothyroidism  Orders:   TSH; Future  Screening for STD (sexually transmitted disease)  Orders:   HIV antibody (with reflex); Future   Hepatitis C Antibody; Future  Screening for lipid disorders  Orders:   Lipid panel; Future   Return in about 6 weeks (around 12/24/2024).   Jackalyn Blazing, MD [facility]    "

## 2024-11-15 ENCOUNTER — Encounter: Payer: Self-pay | Admitting: Sports Medicine

## 2024-11-18 ENCOUNTER — Other Ambulatory Visit

## 2024-12-24 ENCOUNTER — Ambulatory Visit: Admitting: Sports Medicine
